# Patient Record
Sex: Male | Born: 1990 | Race: Black or African American | Hispanic: No | Marital: Single | Smoking: Current every day smoker
Health system: Southern US, Community
[De-identification: ages and names within clinical notes are randomized; demographics above are authoritative.]

## PROBLEM LIST (undated history)

## (undated) HISTORY — PX: MOUTH SURGERY: SHX715

---
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
## 2013-07-24 ENCOUNTER — Emergency Department (HOSPITAL_COMMUNITY): Payer: Medicaid Other

## 2013-07-24 ENCOUNTER — Encounter (HOSPITAL_COMMUNITY): Payer: Self-pay | Admitting: Emergency Medicine

## 2013-07-24 ENCOUNTER — Emergency Department (HOSPITAL_COMMUNITY)
Admission: EM | Admit: 2013-07-24 | Discharge: 2013-07-24 | Disposition: A | Discharge status: Home / Self Care | Payer: Medicaid Other | Attending: Emergency Medicine | Admitting: Emergency Medicine

## 2013-07-24 DIAGNOSIS — S298XXA Other specified injuries of thorax, initial encounter: Secondary | ICD-10-CM | POA: Insufficient documentation

## 2013-07-24 DIAGNOSIS — IMO0002 Reserved for concepts with insufficient information to code with codable children: Secondary | ICD-10-CM | POA: Insufficient documentation

## 2013-07-24 DIAGNOSIS — S299XXA Unspecified injury of thorax, initial encounter: Secondary | ICD-10-CM

## 2013-07-24 DIAGNOSIS — Y9301 Activity, walking, marching and hiking: Secondary | ICD-10-CM | POA: Insufficient documentation

## 2013-07-24 DIAGNOSIS — S0083XA Contusion of other part of head, initial encounter: Secondary | ICD-10-CM

## 2013-07-24 DIAGNOSIS — S1093XA Contusion of unspecified part of neck, initial encounter: Secondary | ICD-10-CM

## 2013-07-24 DIAGNOSIS — S0003XA Contusion of scalp, initial encounter: Secondary | ICD-10-CM | POA: Insufficient documentation

## 2013-07-24 DIAGNOSIS — Y92410 Unspecified street and highway as the place of occurrence of the external cause: Secondary | ICD-10-CM | POA: Insufficient documentation

## 2013-07-24 DIAGNOSIS — R0602 Shortness of breath: Secondary | ICD-10-CM | POA: Insufficient documentation

## 2013-07-24 DIAGNOSIS — Z880 Allergy status to penicillin: Secondary | ICD-10-CM | POA: Insufficient documentation

## 2013-07-24 DIAGNOSIS — F17200 Nicotine dependence, unspecified, uncomplicated: Secondary | ICD-10-CM | POA: Insufficient documentation

## 2013-07-24 MED ORDER — IBUPROFEN 800 MG PO TABS: 800.0000 mg | ORAL_TABLET | Freq: Three times a day (TID) | ORAL | Status: AC

## 2013-07-24 MED ORDER — HYDROCODONE-ACETAMINOPHEN 5-325 MG PO TABS: 1.0000 | ORAL_TABLET | ORAL | Status: DC | PRN

## 2013-07-24 NOTE — ED Notes (Signed)
Called respiratory for an incentive spirometer.   They advised will bring to [facility].

## 2013-07-24 NOTE — Discharge Instructions (Signed)
Call for a follow up appointment with a Family or Primary Care Provider.  Return if Symptoms worsen.   Take medication as prescribed.  Use the incentive spirometer 3-4 times a day. Ice your ribs 3-4 times a day.

## 2013-07-24 NOTE — ED Notes (Signed)
Respiratory advised do not have an incentive spirometer in the ED.  She is going to go find one.
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
## 2013-07-24 NOTE — ED Notes (Signed)
Pt ambulatory with no assistance.  Pt speaking in complete sentences without difficulty.  Pt's VS WDL.

## 2013-07-26 NOTE — ED Provider Notes (Signed)
Medical screening examination/treatment/procedure(s) were performed by non-physician practitioner and as supervising physician I was immediately available for consultation/collaboration.   EKG Interpretation None        Audree CamelScott T Loretha Ure, MD 07/26/13 1355

## 2014-01-06 ENCOUNTER — Emergency Department (HOSPITAL_COMMUNITY)
Admission: EM | Admit: 2014-01-06 | Discharge: 2014-01-06 | Disposition: A | Discharge status: Home / Self Care | Payer: No Typology Code available for payment source | Attending: Emergency Medicine | Admitting: Emergency Medicine

## 2014-01-06 ENCOUNTER — Encounter (HOSPITAL_COMMUNITY): Payer: Self-pay | Admitting: Emergency Medicine

## 2014-01-06 DIAGNOSIS — Z791 Long term (current) use of non-steroidal anti-inflammatories (NSAID): Secondary | ICD-10-CM | POA: Insufficient documentation

## 2014-01-06 DIAGNOSIS — R319 Hematuria, unspecified: Secondary | ICD-10-CM | POA: Insufficient documentation

## 2014-01-06 DIAGNOSIS — Z880 Allergy status to penicillin: Secondary | ICD-10-CM | POA: Insufficient documentation

## 2014-01-06 DIAGNOSIS — F17200 Nicotine dependence, unspecified, uncomplicated: Secondary | ICD-10-CM | POA: Insufficient documentation

## 2014-01-06 DIAGNOSIS — N390 Urinary tract infection, site not specified: Secondary | ICD-10-CM

## 2014-01-06 DIAGNOSIS — N489 Disorder of penis, unspecified: Secondary | ICD-10-CM | POA: Insufficient documentation

## 2014-01-06 LAB — URINALYSIS, ROUTINE W REFLEX MICROSCOPIC
BILIRUBIN URINE: NEGATIVE
Glucose, UA: NEGATIVE mg/dL
KETONES UR: NEGATIVE mg/dL
Nitrite: NEGATIVE
PH: 6.5 (ref 5.0–8.0)
Protein, ur: 30 mg/dL — AB
SPECIFIC GRAVITY, URINE: 1.029 (ref 1.005–1.030)
UROBILINOGEN UA: 0.2 mg/dL (ref 0.0–1.0)

## 2014-01-06 LAB — URINE MICROSCOPIC-ADD ON

## 2014-01-06 MED ORDER — CEPHALEXIN 500 MG PO CAPS: ORAL_CAPSULE | ORAL | Status: AC

## 2014-01-06 MED ORDER — AZITHROMYCIN 250 MG PO TABS
1000.0000 mg | ORAL_TABLET | Freq: Once | ORAL | Status: AC
  Administered 2014-01-06: 1000 mg via ORAL
  Filled 2014-01-06: qty 4

## 2014-01-06 MED ORDER — CEFTRIAXONE SODIUM 250 MG IJ SOLR
250.0000 mg | Freq: Once | INTRAMUSCULAR | Status: AC
  Administered 2014-01-06: 250 mg via INTRAMUSCULAR
  Filled 2014-01-06: qty 250

## 2014-01-06 MED ORDER — LIDOCAINE HCL (PF) 1 % IJ SOLN
INTRAMUSCULAR | Status: AC
  Administered 2014-01-06: 5 mL
  Filled 2014-01-06: qty 5

## 2014-01-06 NOTE — ED Notes (Signed)
He reports that he does have pain

## 2014-01-06 NOTE — ED Provider Notes (Signed)
CSN: [id number]     Arrival date & time 01/06/14  1747 History   This chart was scribed for non-physician practitioner, Junious Silk, PA-C working with Mirian Mo, MD, by Jarvis Morgan, ED Scribe. This patient was seen in room TR05C/TR05C and the patient's care was started at 8:23 PM.    Chief Complaint  Patient presents with  . Hematuria    The history is provided by the patient. No language interpreter was used.   HPI Comments: Preston Stephenson is a 23 y.o. male who presents to the Emergency Department complaining of hematuria that began today. He notes that the blood is bright red. He is have associated dysuria, 1 episode of dyspareunia, erectile pain, and penile pain. No exposure to STDs or any new sexual partners. He denies any nausea, vomiting, back pain, pain with bowel movements, fever, chills, penile discharge, penile rash, or flank pain.   History reviewed. No pertinent past medical history. Past Surgical History  Procedure Laterality Date  . Mouth surgery     No family history on file. History  Substance Use Topics  . Smoking status: Current Every Day Smoker    Types: Cigarettes  . Smokeless tobacco: Not on file  . Alcohol Use: Yes    Review of Systems  Constitutional: Negative for fever and chills.  Gastrointestinal: Negative for nausea, vomiting and abdominal pain.  Genitourinary: Positive for dysuria, hematuria and penile pain. Negative for frequency, flank pain, discharge, penile swelling, scrotal swelling, genital sores and testicular pain.       + dyspareunia and erectile pain  Skin: Negative for rash.  All other systems reviewed and are negative.     Allergies  Penicillins  Home Medications   Prior to Admission medications   Medication Sig Start Date End Date Taking? Authorizing Provider  HYDROcodone-acetaminophen (NORCO/VICODIN) 5-325 MG per tablet Take 1 tablet by mouth every 4 (four) hours as needed. 07/24/13   Mellody Drown, PA-C  ibuprofen  (ADVIL,MOTRIN) 800 MG tablet Take 1 tablet (800 mg total) by mouth 3 (three) times daily. Take with food 07/24/13   Mellody Drown, PA-C   BP 117/60  Pulse 74  Temp(Src) 97.8 F (36.6 C) (Oral)  Resp 22  Ht 6' (1.829 m)  Wt 170 lb (77.111 kg)  BMI 23.05 kg/m2  SpO2 96%  Physical Exam  Nursing note and vitals reviewed. Constitutional: He is oriented to person, place, and time. He appears well-developed and well-nourished. No distress.  HENT:  Head: Normocephalic and atraumatic.  Right Ear: External ear normal.  Left Ear: External ear normal.  Nose: Nose normal.  Eyes: Conjunctivae are normal.  Neck: Normal range of motion. No tracheal deviation present.  Cardiovascular: Normal rate, regular rhythm and normal heart sounds.   Pulmonary/Chest: Effort normal and breath sounds normal. No stridor.  Abdominal: Soft. He exhibits no distension. There is no tenderness. There is no CVA tenderness.  Genitourinary: Testes normal. Right testis shows no tenderness. Left testis shows no tenderness. Circumcised. No penile tenderness. Discharge found.  No penile tenderness. No testicular tenderness or masses. No rashes, no lesions. Yellowy green discharge coming from penile meatus  Musculoskeletal: Normal range of motion.  Neurological: He is alert and oriented to person, place, and time.  Skin: Skin is warm and dry. He is not diaphoretic.  Psychiatric: He has a normal mood and affect. His behavior is normal.    ED Course  Procedures (including critical care time)  DIAGNOSTIC STUDIES: Oxygen Saturation is 96% on RA, normal by  my interpretation.    COORDINATION OF CARE: 8:34 PM- Will order GC/Chlamydia Probe, RPR Stat, HIV antibody, urine culture, UA and urine micro. Will discharge pt with Zithromax and Rocephin.  Pt advised of plan for treatment and pt agrees.    Labs Review Labs Reviewed  URINALYSIS, ROUTINE W REFLEX MICROSCOPIC - Abnormal; Notable for the following:    Color, Urine AMBER  (*)    APPearance TURBID (*)    Hgb urine dipstick TRACE (*)    Protein, ur 30 (*)    Leukocytes, UA LARGE (*)    All other components within normal limits  URINE MICROSCOPIC-ADD ON - Abnormal; Notable for the following:    Bacteria, UA FEW (*)    All other components within normal limits  URINE CULTURE    Imaging Review No results found.   EKG Interpretation None      MDM   Final diagnoses:  UTI (lower urinary tract infection)   Pt has been diagnosed with a UTI. Pt is afebrile, no CVA tenderness, normotensive, and denies N/V. Pt to be dc home with antibiotics and instructions to follow up with PCP if symptoms persist. Patient is sexually active. Concern for STD causing sx and WBC in urine. Given rocephin and azithromycin in ED. HIV antibody and RPR sent. Patient to follow up with [facility]. Discussed reasons to return to ED immediately. Vital signs stable for discharge. Patient / Family / Caregiver informed of clinical course, understand medical decision-making process, and agree with plan.   I personally performed the services described in this documentation, which was scribed in my presence. The recorded information has been reviewed and is accurate.    Mora Bellman, PA-C 01/07/14 2223

## 2014-01-06 NOTE — Discharge Instructions (Signed)

## 2014-01-06 NOTE — ED Notes (Signed)
The pt reports that he has had blood in his urine today.  No pain

## 2014-01-07 LAB — HIV ANTIBODY (ROUTINE TESTING W REFLEX): HIV 1&2 Ab, 4th Generation: NONREACTIVE

## 2014-01-07 LAB — RPR

## 2014-01-07 NOTE — ED Provider Notes (Signed)
Medical screening examination/treatment/procedure(s) were performed by non-physician practitioner and as supervising physician I was immediately available for consultation/collaboration.   EKG Interpretation None        Matthew Gentry, MD 01/07/14 2346 

## 2014-01-08 LAB — URINE CULTURE

## 2014-01-08 LAB — GC/CHLAMYDIA PROBE AMP
CT PROBE, AMP APTIMA: POSITIVE — AB
GC PROBE AMP APTIMA: POSITIVE — AB

## 2014-01-09 ENCOUNTER — Telehealth (HOSPITAL_BASED_OUTPATIENT_CLINIC_OR_DEPARTMENT_OTHER): Payer: Self-pay | Admitting: Emergency Medicine

## 2014-01-09 NOTE — Telephone Encounter (Signed)
Post ED Visit - Positive Culture Follow-up  Culture report reviewed by antimicrobial stewardship pharmacist:  Wes Dulaney, Pharm.D., BCPS  Celedonio Miyamoto, Pharm.D., BCPS  Georgina Pillion, Pharm.D., BCPS  [place], [address].D., BCPS, AAHIVP  Estella Husk, Pharm.D., BCPS, AAHIVP  Carly Sabat, Pharm.D.  [place], [address].D.  Positive chlamydia and gonorrhea culture Treated with zithromax and rocephin, organism sensitive to the same and no further patient follow-up is required at this time.  Berle Mull 01/09/2014, 12:24 PM

## 2018-04-20 ENCOUNTER — Encounter (HOSPITAL_COMMUNITY): Payer: Self-pay

## 2018-04-20 ENCOUNTER — Emergency Department (HOSPITAL_COMMUNITY)
Admission: EM | Admit: 2018-04-20 | Discharge: 2018-04-21 | Disposition: A | Discharge status: Home / Self Care | Payer: Self-pay | Attending: Emergency Medicine | Admitting: Emergency Medicine

## 2018-04-20 DIAGNOSIS — Y998 Other external cause status: Secondary | ICD-10-CM | POA: Insufficient documentation

## 2018-04-20 DIAGNOSIS — Y939 Activity, unspecified: Secondary | ICD-10-CM | POA: Insufficient documentation

## 2018-04-20 DIAGNOSIS — T148XXA Other injury of unspecified body region, initial encounter: Secondary | ICD-10-CM

## 2018-04-20 DIAGNOSIS — Y929 Unspecified place or not applicable: Secondary | ICD-10-CM | POA: Insufficient documentation

## 2018-04-20 DIAGNOSIS — S51812A Laceration without foreign body of left forearm, initial encounter: Secondary | ICD-10-CM | POA: Insufficient documentation

## 2018-04-20 DIAGNOSIS — F17210 Nicotine dependence, cigarettes, uncomplicated: Secondary | ICD-10-CM | POA: Insufficient documentation
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
## 2018-04-20 NOTE — ED Notes (Signed)
Pt asking for medication for the Pain

## 2018-04-21 ENCOUNTER — Emergency Department (HOSPITAL_COMMUNITY): Payer: Self-pay

## 2018-04-21 MED ORDER — HYDROCODONE-ACETAMINOPHEN 5-325 MG PO TABS: 1.0000 | ORAL_TABLET | Freq: Three times a day (TID) | ORAL | 0 refills | Status: AC | PRN

## 2018-04-21 MED ORDER — HYDROCODONE-ACETAMINOPHEN 5-325 MG PO TABS
1.0000 | ORAL_TABLET | Freq: Once | ORAL | Status: AC
  Administered 2018-04-21: 1 via ORAL
  Filled 2018-04-21: qty 1

## 2018-04-21 MED ORDER — ACETAMINOPHEN 500 MG PO TABS: 1000.0000 mg | ORAL_TABLET | Freq: Three times a day (TID) | ORAL | 0 refills | Status: AC
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
## 2018-04-21 NOTE — ED Notes (Signed)
Patient transported to X-ray 
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
## 2018-04-21 NOTE — ED Notes (Signed)
ED Provider at bedside. 

## 2018-05-13 ENCOUNTER — Other Ambulatory Visit: Payer: Self-pay

## 2018-05-13 ENCOUNTER — Emergency Department (HOSPITAL_COMMUNITY)
Admission: EM | Admit: 2018-05-13 | Discharge: 2018-05-13 | Disposition: A | Discharge status: Home / Self Care | Payer: Self-pay | Attending: Emergency Medicine | Admitting: Emergency Medicine

## 2018-05-13 ENCOUNTER — Encounter (HOSPITAL_COMMUNITY): Payer: Self-pay | Admitting: Emergency Medicine

## 2018-05-13 DIAGNOSIS — J111 Influenza due to unidentified influenza virus with other respiratory manifestations: Secondary | ICD-10-CM

## 2018-05-13 DIAGNOSIS — Z79899 Other long term (current) drug therapy: Secondary | ICD-10-CM | POA: Insufficient documentation

## 2018-05-13 DIAGNOSIS — F17210 Nicotine dependence, cigarettes, uncomplicated: Secondary | ICD-10-CM | POA: Insufficient documentation

## 2018-05-13 DIAGNOSIS — J101 Influenza due to other identified influenza virus with other respiratory manifestations: Secondary | ICD-10-CM | POA: Insufficient documentation

## 2018-05-13 DIAGNOSIS — R69 Illness, unspecified: Secondary | ICD-10-CM

## 2018-05-13 LAB — GROUP A STREP BY PCR: Group A Strep by PCR: NOT DETECTED

## 2018-05-13 MED ORDER — OSELTAMIVIR PHOSPHATE 75 MG PO CAPS: 75.0000 mg | ORAL_CAPSULE | Freq: Two times a day (BID) | ORAL | 0 refills | Status: AC

## 2018-05-13 NOTE — Discharge Instructions (Addendum)
You were seen in the ER for a flu like illness, with your son having the flu we suspect that you do as well. Your strep test was negative  We are prescribing tamiflu for this- take this as prescribed.  We have prescribed you new medication(s) today. Discuss the medications prescribed today with your pharmacist as they can have adverse effects and interactions with your other medicines including over the counter and prescribed medications. Seek medical evaluation if you start to experience new or abnormal symptoms after taking one of these medicines, seek care immediately if you start to experience difficulty breathing, feeling of your throat closing, facial swelling, or rash as these could be indications of a more serious allergic reaction  Continue motrin/tylenol at home for pain/fevers. Stay well hydrated.   Follow up with primary care within 1 week. Return to the ER for new or worsening symptoms or any other concerns.

## 2018-05-13 NOTE — ED Notes (Signed)
Pt c/o generalized body aches, elevated temp and sore throat

## 2018-05-13 NOTE — ED Triage Notes (Signed)
Pt here with c/o flu like symptoms for 2 day s

## 2018-05-13 NOTE — ED Provider Notes (Signed)
[facility] Note   CSN: [id number] Arrival date & time: 05/13/18  1504     History   Chief Complaint Chief Complaint  Patient presents with  . Influenza    HPI Preston Stephenson is a 28 y.o. male with a hx of tobacco abuse who presents to the emergency department with flulike symptoms for the past 2 days.  Patient reports he is having sore throat, subjective fevers, chills, and body aches.  Symptoms are constant, mildly alleviated with ibuprofen, no significant aggravating factors.  Current pain is a 7 out of 10 in severity.  Denies congestion, cough, dyspnea, change in voice, drooling, or vomiting.  Patient's son was diagnosed with influenza and treated with Tamiflu within the past 1 week.  HPI  History reviewed. No pertinent past medical history.  There are no active problems to display for this patient.   Past Surgical History:  Procedure Laterality Date  . MOUTH SURGERY          Home Medications    Prior to Admission medications   Medication Sig Start Date End Date Taking? Authorizing Provider  cephALEXin (KEFLEX) 500 MG capsule 2 caps po bid x 7 days 01/06/14   Junious SilkMerrell, Hannah, PA-C  ibuprofen (ADVIL,MOTRIN) 800 MG tablet Take 1 tablet (800 mg total) by mouth 3 (three) times daily. Take with food 07/24/13   Mellody DrownParker, Lauren, PA-C    Family History No family history on file.  Social History Social History   Tobacco Use  . Smoking status: Current Every Day Smoker    Types: Cigarettes  . Smokeless tobacco: Never Used  Substance Use Topics  . Alcohol use: Yes  . Drug use: Yes    Types: Marijuana     Allergies   Penicillins   Review of Systems Review of Systems  Constitutional: Positive for chills and fever.  HENT: Positive for sore throat. Negative for trouble swallowing and voice change.   Respiratory: Negative for cough and shortness of breath.   Cardiovascular: Negative for chest pain.  Musculoskeletal:  Positive for myalgias.     Physical Exam Updated Vital Signs BP 124/75   Pulse 78   Temp 98.8 F (37.1 C) (Oral)   Resp 18   Ht 6' (1.829 m)   Wt 86.2 kg   SpO2 99%   BMI 25.77 kg/m   Physical Exam Vitals signs and nursing note reviewed.  Constitutional:      General: He is not in acute distress.    Appearance: He is well-developed. He is not toxic-appearing.  HENT:     Head: Normocephalic and atraumatic.     Right Ear: Tympanic membrane, ear canal and external ear normal.     Left Ear: Tympanic membrane, ear canal and external ear normal.     Nose: Nose normal.     Right Sinus: No maxillary sinus tenderness or frontal sinus tenderness.     Left Sinus: No maxillary sinus tenderness or frontal sinus tenderness.     Mouth/Throat:     Pharynx: Uvula midline. Posterior oropharyngeal erythema present. No oropharyngeal exudate.     Comments: Posterior oropharynx is symmetric appearing. Patient tolerating own secretions without difficulty. No trismus. No drooling. No hot potato voice. No swelling beneath the tongue, submandibular compartment is soft.  Eyes:     General:        Right eye: No discharge.        Left eye: No discharge.     Conjunctiva/sclera: Conjunctivae normal.  Neck:     Musculoskeletal: Neck supple. No edema or neck rigidity.  Cardiovascular:     Rate and Rhythm: Normal rate and regular rhythm.  Pulmonary:     Effort: Pulmonary effort is normal. No respiratory distress.     Breath sounds: Normal breath sounds. No wheezing, rhonchi or rales.  Abdominal:     General: There is no distension.     Palpations: Abdomen is soft.     Tenderness: There is no abdominal tenderness.  Lymphadenopathy:     Cervical: Cervical adenopathy (mild bilateral anterior) present.  Skin:    General: Skin is warm and dry.     Findings: No rash.  Neurological:     Mental Status: He is alert.     Comments: Clear speech.   Psychiatric:        Behavior: Behavior normal.       ED Treatments / Results  Labs (all labs ordered are listed, but only abnormal results are displayed) Labs Reviewed  GROUP A STREP BY PCR    EKG None  Radiology No results found.  Procedures Procedures (including critical care time)  Medications Ordered in ED Medications - No data to display   Initial Impression / Assessment and Plan / ED Course  I have reviewed the triage vital signs and the nursing notes.  Pertinent labs & imaging results that were available during my care of the patient were reviewed by me and considered in my medical decision making (see chart for details).    Patient presents with flu like symptoms.  Patient is nontoxic appearing, in no apparent distress, vitals are WNL. Patient is afebrile in the ED, lungs are CTA, doubt pneumonia. There is no wheezing or signs of respiratory distress. Sxs onset < 7 days, afebrile, no sinus tenderness, doubt acute bacterial sinusitis. Strep negative. No evidence of RPA/PTA. No evidence of AOM on exam. No meningeal signs. Patient with known sick contact w/ his son who is flu positive and being treated w/ tamiflu. Will provide tamiflu as patient would like to receive this, continue supportive care at home, PCP follow up.  I discussed results, treatment plan, need for PCP follow-up, and return precautions with the patient. Provided opportunity for questions, patient confirmed understanding and is in agreement with plan.    Final Clinical Impressions(s) / ED Diagnoses   Final diagnoses:  Influenza-like illness    ED Discharge Orders         Ordered    oseltamivir (TAMIFLU) 75 MG capsule  Every 12 hours     05/13/18 [address] [place], PA-C 05/13/18 1726    Wynetta Fines, MD 05/13/18 1819
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
